# Patient Record
Sex: Female | Born: 1960 | Race: White | Hispanic: No | Smoking: Current every day smoker
Health system: Southern US, Community
[De-identification: ages and names within clinical notes are randomized; demographics above are authoritative.]

## PROBLEM LIST (undated history)

## (undated) DIAGNOSIS — M359 Systemic involvement of connective tissue, unspecified: Secondary | ICD-10-CM

## (undated) DIAGNOSIS — C801 Malignant (primary) neoplasm, unspecified: Secondary | ICD-10-CM

## (undated) DIAGNOSIS — I2510 Atherosclerotic heart disease of native coronary artery without angina pectoris: Secondary | ICD-10-CM

## (undated) DIAGNOSIS — J45909 Unspecified asthma, uncomplicated: Secondary | ICD-10-CM

## (undated) HISTORY — DX: Atherosclerotic heart disease of native coronary artery without angina pectoris: I25.10

## (undated) HISTORY — PX: CARPAL TUNNEL RELEASE: SHX101

---

## 2015-10-19 ENCOUNTER — Encounter (INDEPENDENT_AMBULATORY_CARE_PROVIDER_SITE_OTHER): Payer: Self-pay | Admitting: Internal Medicine

## 2015-10-20 ENCOUNTER — Ambulatory Visit (INDEPENDENT_AMBULATORY_CARE_PROVIDER_SITE_OTHER): Payer: Medicare Other | Admitting: Internal Medicine

## 2015-10-20 ENCOUNTER — Encounter (INDEPENDENT_AMBULATORY_CARE_PROVIDER_SITE_OTHER): Payer: Self-pay

## 2015-10-20 ENCOUNTER — Encounter (INDEPENDENT_AMBULATORY_CARE_PROVIDER_SITE_OTHER): Payer: Self-pay | Admitting: *Deleted

## 2015-10-20 ENCOUNTER — Encounter (INDEPENDENT_AMBULATORY_CARE_PROVIDER_SITE_OTHER): Payer: Self-pay | Admitting: Internal Medicine

## 2015-10-20 VITALS — BP 118/76 | HR 78 | Temp 97.9°F | Resp 18 | Ht 66.5 in | Wt 172.1 lb

## 2015-10-20 DIAGNOSIS — R1011 Right upper quadrant pain: Secondary | ICD-10-CM | POA: Diagnosis not present

## 2015-10-20 NOTE — Patient Instructions (Signed)
HIDA scan. Hepatic function.  

## 2015-10-20 NOTE — Progress Notes (Signed)
   Subjective:    Patient ID: Felicia Noble, female    DOB: 1961/03/28, 55 y.o.   MRN: [id number]  HPI  Referred by Dr. Pat Kocher for RUQ pain.  She tells me she continues to have some RUQ pain off and on for 6 months. The pain is not severe. THere has been no injury.  The pain really became worse last month. Pain occurs after she eats. No vomiting but she does have nausea. Appetite is good and sometimes not. She has lost 7 pounds over the past 3 weeks. She denies any acid reflux.  She usually has a BM every few days.     03/07/2015 H and H 14.1 nd 42.1. WBC 10.1 10/07/2015 AST 39, ALT 41, ALP 105, total bili 0.5, direct bili 0.3,  10/13/2015 10/13/2015 US abdomen: abdominal pain RUQ CBD dilatation at 6.51mm.  Tenderness rt upper quadrant however no other sonographic evidence for  Acute cholecystitis is present.  Review of Systems Past Medical History  Diagnosis Date  . CAD (coronary artery disease)     Past Surgical History  Procedure Laterality Date  . Carpal tunnel release      bilateral    Allergies  Allergen Reactions  . Amoxicillin Rash  . Biaxin [Clarithromycin]   . Carisoprodol   . Citalopram   . Cymbalta [Duloxetine Hcl]   . Darvon [Propoxyphene]   . Ibuprofen   . Lexapro [Escitalopram Oxalate]   . Meloxicam     Analgesics  . Penicillins   . Tape     Cloth  . Trazodone And Nefazodone   . Viibryd [Vilazodone Hcl]   . Aspirin Rash  . Codeine Rash  . Nystatin Rash  . Soma Compound With Codeine [Carisoprodol-Aspirin-Codeine] Rash    No current outpatient prescriptions on file prior to visit.   No current facility-administered medications on file prior to visit.   No current outpatient prescriptions on file prior to visit.   No current facility-administered medications on file prior to visit.   Current Outpatient Prescriptions  Medication Sig Dispense Refill  . aspirin EC 81 MG tablet Take 81 mg by mouth daily.    . fexofenadine (ALLEGRA) 180 MG tablet  Take 180 mg by mouth daily.    . OXYCODONE HCL PO Take by mouth. Patient takes 1 by mouth every 6 hours.    . ranitidine (ZANTAC) 300 MG capsule Take 300 mg by mouth daily.     No current facility-administered medications for this visit.         Objective:   Physical Exam Blood pressure 118/76, pulse 78, temperature 97.9 F (36.6 C), temperature source Oral, resp. rate 18, height 5' 6.5" (1.689 m), weight 172 lb 1.6 oz (78.064 kg).  Alert and oriented. Skin warm and dry. Oral mucosa is moist.   . Sclera anicteric, conjunctivae is pink. Thyroid not enlarged. No cervical lymphadenopathy. Lungs clear. Heart regular rate and rhythm.  Abdomen is soft. Bowel sounds are positive. No hepatomegaly. No abdominal masses felt. Tenderness rt upper quadrant.  No tenderness to epigastric region.  No edema to lower extremities.        Assessment & Plan:  RUQ pain with normal liver enzymes. Will r/o GB disease.  Will get a HIDA scan and Hepatic function. Further recommendations to follow.

## 2015-10-21 LAB — HEPATIC FUNCTION PANEL
ALK PHOS: 80 U/L (ref 33–130)
ALT: 17 U/L (ref 6–29)
AST: 17 U/L (ref 10–35)
Albumin: 4.9 g/dL (ref 3.6–5.1)
BILIRUBIN DIRECT: 0.1 mg/dL (ref <=0.2)
Indirect Bilirubin: 0.2 mg/dL (ref 0.2–1.2)
TOTAL PROTEIN: 7.5 g/dL (ref 6.1–8.1)
Total Bilirubin: 0.3 mg/dL (ref 0.2–1.2)

## 2015-10-27 ENCOUNTER — Other Ambulatory Visit (INDEPENDENT_AMBULATORY_CARE_PROVIDER_SITE_OTHER): Payer: Self-pay | Admitting: Internal Medicine

## 2015-10-27 ENCOUNTER — Encounter (HOSPITAL_COMMUNITY): Admission: RE | Admit: 2015-10-27 | Payer: Medicare Other | Source: Ambulatory Visit

## 2015-10-27 DIAGNOSIS — R1011 Right upper quadrant pain: Secondary | ICD-10-CM

## 2015-11-01 ENCOUNTER — Encounter (HOSPITAL_COMMUNITY): Payer: Self-pay

## 2015-11-01 ENCOUNTER — Encounter (HOSPITAL_COMMUNITY)
Admission: RE | Admit: 2015-11-01 | Discharge: 2015-11-01 | Disposition: A | Discharge status: Home / Self Care | Payer: Medicare Other | Source: Ambulatory Visit | Attending: Internal Medicine | Admitting: Internal Medicine

## 2015-11-01 ENCOUNTER — Encounter (HOSPITAL_COMMUNITY): Payer: Medicare Other

## 2015-11-01 DIAGNOSIS — R1011 Right upper quadrant pain: Secondary | ICD-10-CM | POA: Diagnosis present

## 2015-11-01 HISTORY — DX: Unspecified asthma, uncomplicated: J45.909

## 2015-11-01 HISTORY — DX: Malignant (primary) neoplasm, unspecified: C80.1

## 2015-11-01 HISTORY — DX: Systemic involvement of connective tissue, unspecified: M35.9

## 2015-11-01 MED ORDER — SODIUM CHLORIDE 0.9% FLUSH
INTRAVENOUS | Status: AC
  Filled 2015-11-01: qty 40

## 2015-11-01 MED ORDER — STERILE WATER FOR INJECTION IJ SOLN
INTRAMUSCULAR | Status: AC
  Administered 2015-11-01: 1.55 mL via INTRAVENOUS
  Filled 2015-11-01: qty 10

## 2015-11-01 MED ORDER — TECHNETIUM TC 99M MEBROFENIN IV KIT
5.0000 | PACK | Freq: Once | INTRAVENOUS | Status: AC | PRN
  Administered 2015-11-01: 5 via INTRAVENOUS

## 2015-11-01 MED ORDER — SINCALIDE 5 MCG IJ SOLR
INTRAMUSCULAR | Status: AC
  Administered 2015-11-01: 1.55 ug via INTRAVENOUS
  Filled 2015-11-01: qty 5

## 2015-11-04 ENCOUNTER — Telehealth (INDEPENDENT_AMBULATORY_CARE_PROVIDER_SITE_OTHER): Payer: Self-pay | Admitting: Internal Medicine

## 2015-11-04 NOTE — Telephone Encounter (Signed)
Patient called, would like her results before we leave today.  She is out working in the field, if you get her voice mail, she has asked that we leave it on her voice mail.

## 2015-11-07 ENCOUNTER — Telehealth (INDEPENDENT_AMBULATORY_CARE_PROVIDER_SITE_OTHER): Payer: Self-pay | Admitting: Internal Medicine

## 2015-11-07 NOTE — Telephone Encounter (Signed)
Patient called, returning Terri's call, said she missed her call.  [phone]

## 2015-11-22 ENCOUNTER — Encounter (INDEPENDENT_AMBULATORY_CARE_PROVIDER_SITE_OTHER): Payer: Self-pay | Admitting: Internal Medicine

## 2015-11-22 NOTE — Progress Notes (Signed)
Patient was given an appointment for 02/09/16 at 11:30am.  A letter was mailed to the patient.

## 2015-11-23 NOTE — Telephone Encounter (Signed)
Results given to patient. All are normal. She describes as soreness. She will call if pain increases. She had an EGD by Dr. ?Posey Pronto and she is going to try to locate this

## 2015-11-29 ENCOUNTER — Ambulatory Visit (INDEPENDENT_AMBULATORY_CARE_PROVIDER_SITE_OTHER): Payer: Medicare Other | Admitting: Internal Medicine

## 2015-12-08 ENCOUNTER — Encounter (INDEPENDENT_AMBULATORY_CARE_PROVIDER_SITE_OTHER): Payer: Self-pay | Admitting: Internal Medicine

## 2015-12-08 ENCOUNTER — Ambulatory Visit (INDEPENDENT_AMBULATORY_CARE_PROVIDER_SITE_OTHER): Payer: Medicare Other | Admitting: Internal Medicine

## 2015-12-08 VITALS — BP 112/80 | HR 60 | Temp 97.4°F | Ht 67.0 in | Wt 168.7 lb

## 2015-12-08 DIAGNOSIS — R1011 Right upper quadrant pain: Secondary | ICD-10-CM

## 2015-12-08 DIAGNOSIS — Z87891 Personal history of nicotine dependence: Secondary | ICD-10-CM

## 2015-12-08 DIAGNOSIS — Z720 Tobacco use: Secondary | ICD-10-CM

## 2015-12-08 MED ORDER — OMEPRAZOLE 40 MG PO CPDR: 40.0000 mg | DELAYED_RELEASE_CAPSULE | Freq: Every day | ORAL | 3 refills | Status: DC

## 2015-12-08 MED ORDER — VARENICLINE TARTRATE 0.5 MG PO TABS: 0.5000 mg | ORAL_TABLET | Freq: Two times a day (BID) | ORAL | 3 refills | Status: AC

## 2015-12-08 NOTE — Patient Instructions (Addendum)
OV in 6 months. Continue the Omeprazole. 

## 2015-12-08 NOTE — Progress Notes (Signed)
   Subjective:    Patient ID: Felicia Noble, female    DOB: 04/07/1961, 55 y.o.   MRN: [id number]  HPI Here today for f/u of her RUQ soreness.  She underwent an [place] which was normal. HIDA scan normal. Liver enzymes are normal. She says she occasionally has acid reflux and takes Zantac as needed. She tries to avoid spicy foods, and fatty foods. Her appetite has remained good. No weight loss. She usually has a BM daily. She requests an Rx today for Chantix.     Review of Systems Past Medical History:  Diagnosis Date  . Asthma   . CAD (coronary artery disease)   . Cancer ([place])    Cervical  . Collagen vascular disease ([place])     Past Surgical History:  Procedure Laterality Date  . CARPAL TUNNEL RELEASE     bilateral    Allergies  Allergen Reactions  . Amoxicillin Rash  . Biaxin [Clarithromycin]   . Carisoprodol   . Citalopram   . Cymbalta [Duloxetine Hcl]   . Darvon [Propoxyphene]   . Ibuprofen   . Lexapro [Escitalopram Oxalate]   . Meloxicam     Analgesics  . Penicillins   . Tape     Cloth  . Trazodone And Nefazodone   . Viibryd [Vilazodone Hcl]   . Aspirin Rash  . Codeine Rash  . Nystatin Rash  . Soma Compound With Codeine [Carisoprodol-Aspirin-Codeine] Rash    Current Outpatient Prescriptions on File Prior to Visit  Medication Sig Dispense Refill  . aspirin EC 81 MG tablet Take 81 mg by mouth daily.    . fexofenadine (ALLEGRA) 180 MG tablet Take 180 mg by mouth daily.    . OXYCODONE HCL PO Take by mouth. Patient takes 1 by mouth every 6 hours.    . ranitidine (ZANTAC) 300 MG capsule Take 300 mg by mouth daily.     No current facility-administered medications on file prior to visit.        Objective:   Physical Exam Blood pressure 112/80, pulse 60, temperature 97.4 F (36.3 C), height 5\' 7"  (1.702 m), weight 168 lb 11.2 oz (76.5 kg).  Alert and oriented. Skin warm and dry. Oral mucosa is moist.   . Sclera anicteric, conjunctivae is pink. Thyroid not  enlarged. No cervical lymphadenopathy. Lungs clear. Heart regular rate and rhythm.  Abdomen is soft. Bowel sounds are positive. No hepatomegaly. No abdominal masses felt. No tenderness.  No edema to lower extremities..      Assessment & Plan:  RUQ tenderness. Possible GERD. Rx for Omeprazole called to her pharmacy. Smoking cessation. Rx for Chantix called to her pharmacy. OV in 6 months.

## 2015-12-23 ENCOUNTER — Encounter (INDEPENDENT_AMBULATORY_CARE_PROVIDER_SITE_OTHER): Payer: Self-pay

## 2016-02-09 ENCOUNTER — Ambulatory Visit (INDEPENDENT_AMBULATORY_CARE_PROVIDER_SITE_OTHER): Payer: Medicare Other | Admitting: Internal Medicine

## 2016-06-11 ENCOUNTER — Ambulatory Visit (INDEPENDENT_AMBULATORY_CARE_PROVIDER_SITE_OTHER): Payer: Medicare Other | Admitting: Internal Medicine

## 2016-06-12 ENCOUNTER — Encounter (INDEPENDENT_AMBULATORY_CARE_PROVIDER_SITE_OTHER): Payer: Self-pay | Admitting: Internal Medicine

## 2016-12-09 IMAGING — NM NM HEPATO W/GB/PHARM/[PERSON_NAME]
2 series · 12 of 12 positions shown · non-contrast
Comparison: None.

CLINICAL DATA: Right upper quadrant abdominal pain for 6 months.

EXAM:
NUCLEAR MEDICINE HEPATOBILIARY IMAGING WITH GALLBLADDER EF
TECHNIQUE: Sequential images of the abdomen were obtained [DATE] minutes
following intravenous administration of radiopharmaceutical. After
slow intravenous infusion of 1.55 micrograms Cholecystokinin,
gallbladder ejection fraction was determined.
RADIOPHARMACEUTICALS:  5.0 mCi Oc-MMm Choletec IV

[Series 1: biliary · 3.25mm/px · 6 of 60 frames shown]
[frame 6/60]
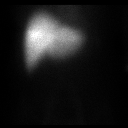
[frame 16/60]
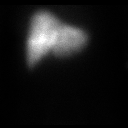
[frame 26/60]
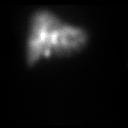
[frame 36/60]
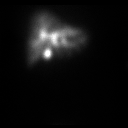
[frame 46/60]
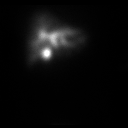
[frame 56/60]
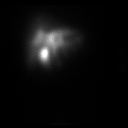

[Series 2: gbef · 3.25mm/px · 6 of 60 frames shown]
[frame 6/60]
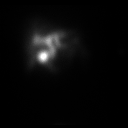
[frame 16/60]
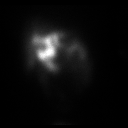
[frame 26/60]
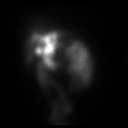
[frame 36/60]
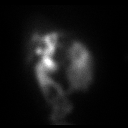
[frame 46/60]
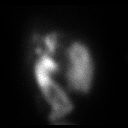
[frame 56/60]
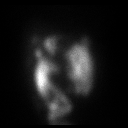

[12 of 12 positions shown; findings below may reference images not displayed]

FINDINGS: Prompt uptake and biliary excretion of activity by the liver is
seen. Gallbladder activity is visualized, consistent with patency of
cystic duct. Biliary activity passes into small bowel, consistent
with patent common bile duct.

Calculated gallbladder ejection fraction is 91%. (At 60 min, normal
ejection fraction is greater than 40%.)
IMPRESSION: Normal and timely uptake within the gallbladder. Normal gallbladder
ejection fraction is noted after CCK administration.

## 2017-10-30 ENCOUNTER — Other Ambulatory Visit (INDEPENDENT_AMBULATORY_CARE_PROVIDER_SITE_OTHER): Payer: Self-pay | Admitting: Internal Medicine

## 2017-10-30 DIAGNOSIS — R1011 Right upper quadrant pain: Secondary | ICD-10-CM

## 2019-10-28 ENCOUNTER — Ambulatory Visit (INDEPENDENT_AMBULATORY_CARE_PROVIDER_SITE_OTHER): Payer: Medicare Other | Admitting: Gastroenterology

## 2020-05-05 ENCOUNTER — Other Ambulatory Visit (INDEPENDENT_AMBULATORY_CARE_PROVIDER_SITE_OTHER): Payer: Self-pay | Admitting: Internal Medicine

## 2020-05-05 DIAGNOSIS — R1011 Right upper quadrant pain: Secondary | ICD-10-CM
